# Patient Record
Sex: Female | Born: 1985 | Race: Black or African American | Hispanic: No | Marital: Married | Smoking: Current every day smoker
Health system: Southern US, Community
[De-identification: ages and names within clinical notes are randomized; demographics above are authoritative.]

---

## 2015-04-08 ENCOUNTER — Other Ambulatory Visit (HOSPITAL_COMMUNITY): Payer: Self-pay | Admitting: Specialist

## 2015-04-08 DIAGNOSIS — IMO0002 Reserved for concepts with insufficient information to code with codable children: Secondary | ICD-10-CM

## 2015-04-08 DIAGNOSIS — Z0489 Encounter for examination and observation for other specified reasons: Secondary | ICD-10-CM

## 2015-04-16 ENCOUNTER — Ambulatory Visit (HOSPITAL_COMMUNITY)
Admission: RE | Admit: 2015-04-16 | Discharge: 2015-04-16 | Disposition: A | Discharge status: Home / Self Care | Payer: Medicaid Other | Source: Ambulatory Visit | Attending: Specialist | Admitting: Specialist

## 2015-04-16 ENCOUNTER — Encounter (HOSPITAL_COMMUNITY): Payer: Self-pay

## 2015-04-16 DIAGNOSIS — Z3A25 25 weeks gestation of pregnancy: Secondary | ICD-10-CM | POA: Diagnosis not present

## 2015-04-16 DIAGNOSIS — O99212 Obesity complicating pregnancy, second trimester: Secondary | ICD-10-CM | POA: Insufficient documentation

## 2015-04-16 DIAGNOSIS — Z36 Encounter for antenatal screening of mother: Secondary | ICD-10-CM | POA: Insufficient documentation

## 2015-04-16 DIAGNOSIS — IMO0002 Reserved for concepts with insufficient information to code with codable children: Secondary | ICD-10-CM

## 2015-04-16 DIAGNOSIS — Z0489 Encounter for examination and observation for other specified reasons: Secondary | ICD-10-CM

## 2015-04-16 DIAGNOSIS — O3421 Maternal care for scar from previous cesarean delivery: Secondary | ICD-10-CM | POA: Insufficient documentation

## 2015-06-05 ENCOUNTER — Other Ambulatory Visit (HOSPITAL_COMMUNITY): Payer: Self-pay | Admitting: Specialist

## 2015-06-05 DIAGNOSIS — IMO0002 Reserved for concepts with insufficient information to code with codable children: Secondary | ICD-10-CM

## 2015-06-05 DIAGNOSIS — Z0489 Encounter for examination and observation for other specified reasons: Secondary | ICD-10-CM

## 2015-06-09 ENCOUNTER — Ambulatory Visit (HOSPITAL_COMMUNITY)
Admission: RE | Admit: 2015-06-09 | Discharge: 2015-06-09 | Disposition: A | Discharge status: Home / Self Care | Payer: Medicaid Other | Source: Ambulatory Visit | Attending: Specialist | Admitting: Specialist

## 2015-06-09 DIAGNOSIS — Z36 Encounter for antenatal screening of mother: Secondary | ICD-10-CM | POA: Insufficient documentation

## 2015-06-09 DIAGNOSIS — IMO0002 Reserved for concepts with insufficient information to code with codable children: Secondary | ICD-10-CM

## 2015-06-09 DIAGNOSIS — Z0489 Encounter for examination and observation for other specified reasons: Secondary | ICD-10-CM

## 2016-02-19 ENCOUNTER — Encounter (HOSPITAL_COMMUNITY): Payer: Self-pay | Admitting: *Deleted

## 2016-07-19 ENCOUNTER — Other Ambulatory Visit (HOSPITAL_COMMUNITY): Payer: Self-pay | Admitting: Obstetrics and Gynecology

## 2016-07-20 ENCOUNTER — Other Ambulatory Visit (HOSPITAL_COMMUNITY): Payer: Self-pay | Admitting: Obstetrics and Gynecology

## 2016-07-20 DIAGNOSIS — O99212 Obesity complicating pregnancy, second trimester: Secondary | ICD-10-CM

## 2016-07-20 DIAGNOSIS — Z3A25 25 weeks gestation of pregnancy: Secondary | ICD-10-CM

## 2016-07-21 ENCOUNTER — Encounter (HOSPITAL_COMMUNITY): Payer: Self-pay | Admitting: Obstetrics and Gynecology

## 2016-08-02 ENCOUNTER — Other Ambulatory Visit (HOSPITAL_COMMUNITY): Payer: Self-pay | Admitting: Obstetrics and Gynecology

## 2016-08-02 ENCOUNTER — Ambulatory Visit (HOSPITAL_COMMUNITY)
Admission: RE | Admit: 2016-08-02 | Discharge: 2016-08-02 | Disposition: A | Discharge status: Home / Self Care | Payer: Medicaid Other | Source: Ambulatory Visit | Attending: Obstetrics and Gynecology | Admitting: Obstetrics and Gynecology

## 2016-08-02 DIAGNOSIS — Z1389 Encounter for screening for other disorder: Secondary | ICD-10-CM

## 2016-08-02 DIAGNOSIS — Z3A25 25 weeks gestation of pregnancy: Secondary | ICD-10-CM | POA: Diagnosis not present

## 2016-08-02 DIAGNOSIS — O99212 Obesity complicating pregnancy, second trimester: Secondary | ICD-10-CM | POA: Diagnosis present

## 2016-08-02 IMAGING — US US MFM OB DETAIL+14 WK
1 series · 13 of 28 positions shown · non-contrast
Comparison: none

[Series 1: us mfm ob detail+14 wk · 13 of 42 slices shown]
[im 2/42]
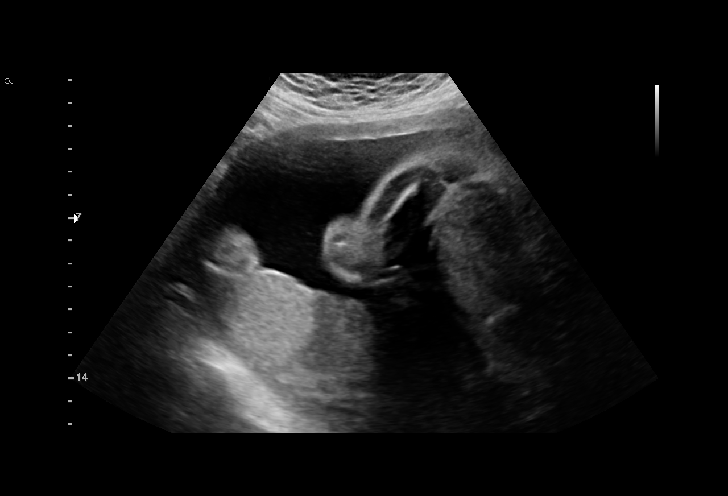
[im 5/42]
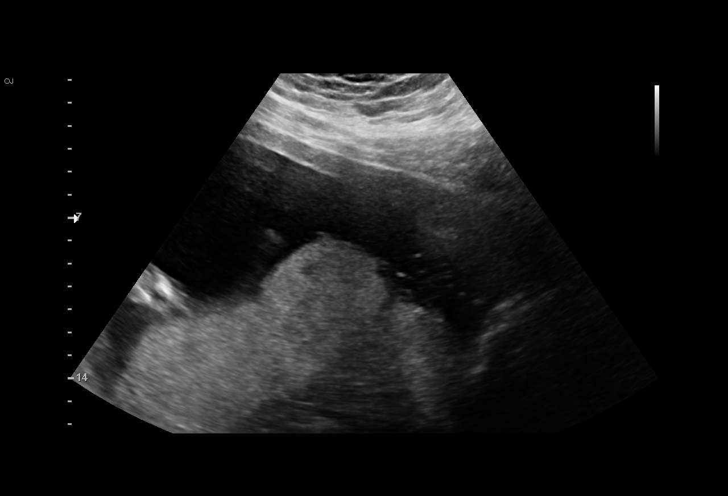
[im 8/42]
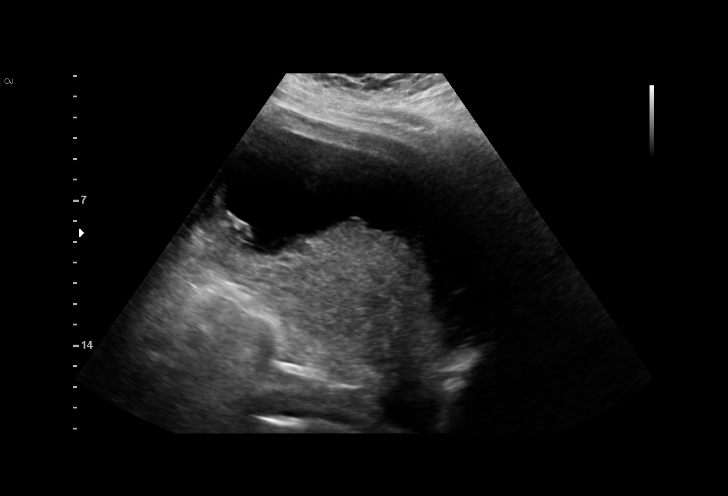
[im 11/42]
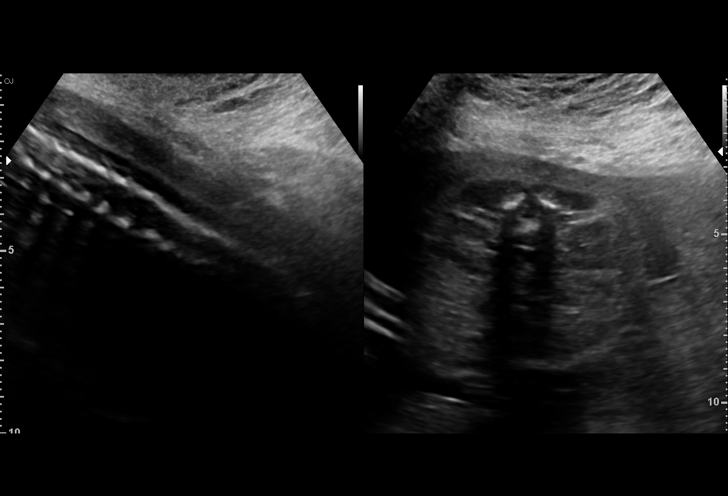
[im 14/42]
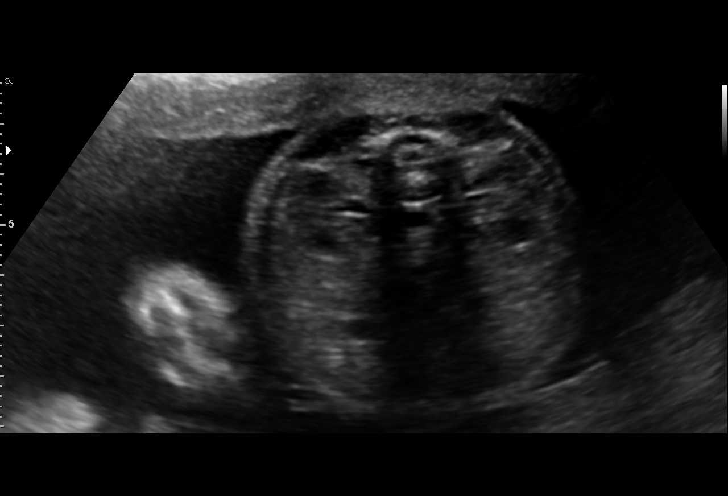
[im 17/42]
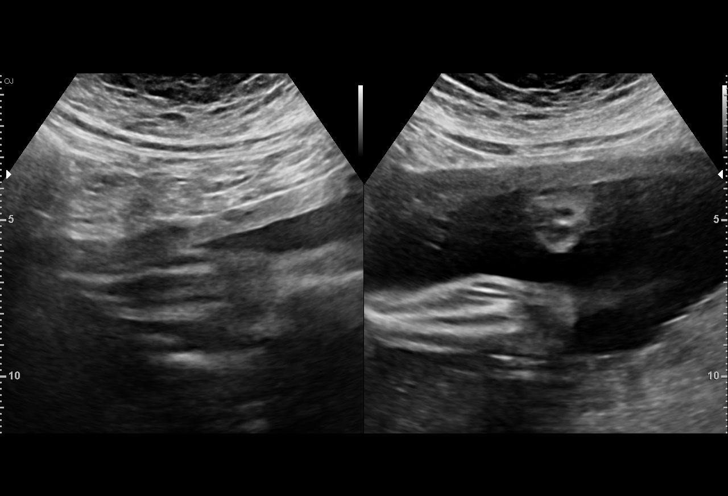
[im 22/42]
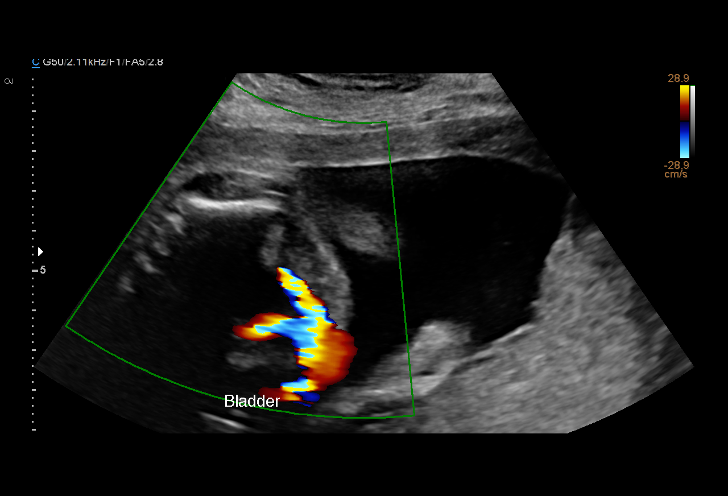
[im 25/42]
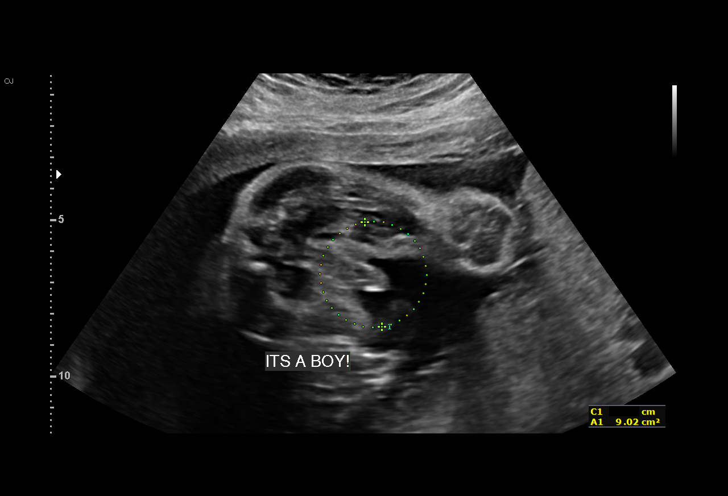
[im 28/42]
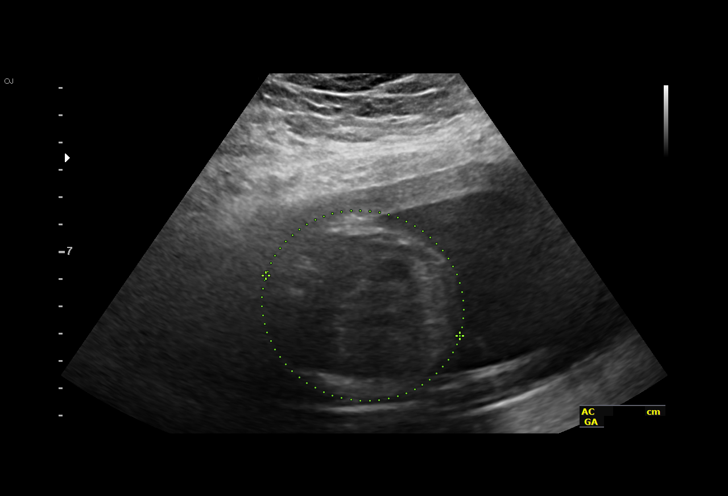
[im 31/42]
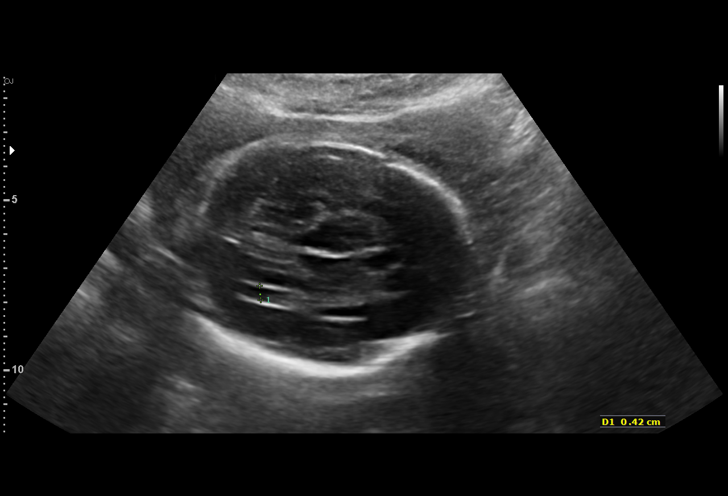
[im 34/42]
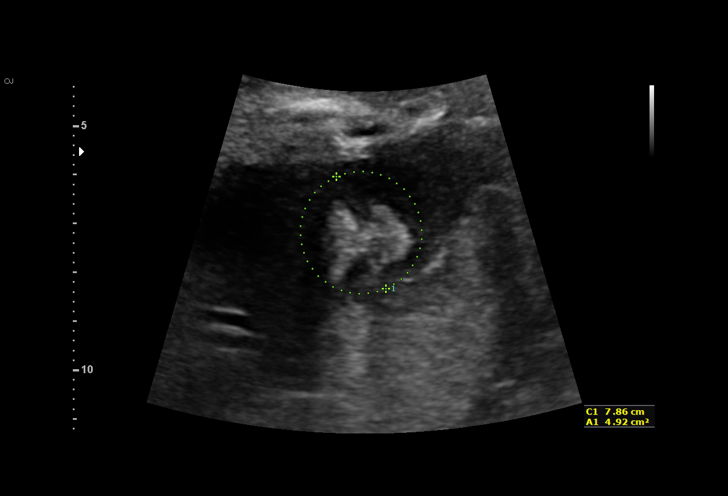
[im 37/42]
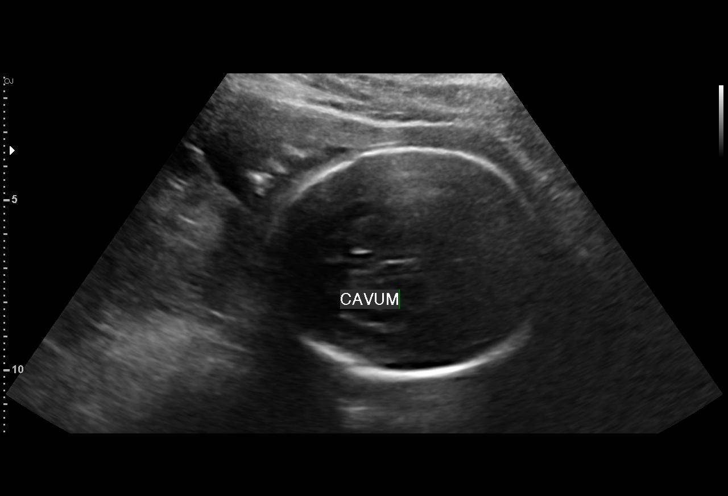
[im 40/42]
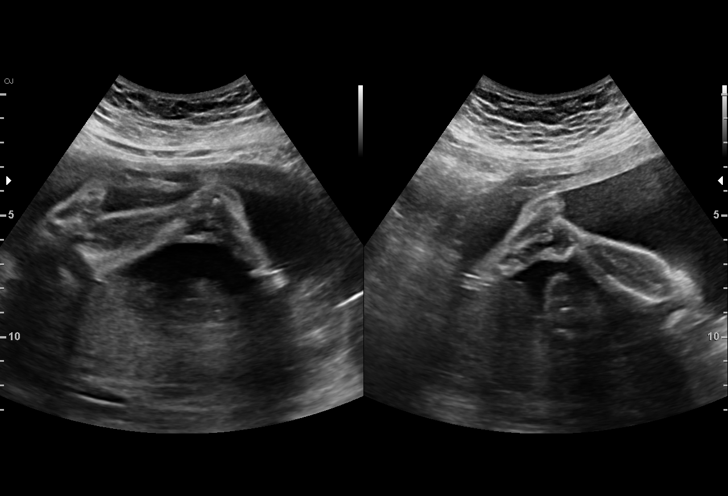

[13 of 28 positions shown; findings below may reference images not displayed]

[HOSPITAL][HOSPITAL]

1  [facility]             [PHONE_NUMBER]      [PHONE_NUMBER]     [PHONE_NUMBER]
Indications

25 weeks gestation of pregnancy
Maternal morbid obesity                        [7L] [7L]
Encounter for antenatal screening,             [7L]
unspecified
OB History

Blood Type:            Height:         Weight (lb):  341      BMI:
Gravidity:    2         Term:   1        Prem:   0        SAB:   0
TOP:          0       Ectopic:  0        Living: 2
Fetal Evaluation

Num Of Fetuses:     1
Fetal Heart         143
Rate(bpm):
Cardiac Activity:   Observed
Presentation:       Cephalic
Placenta:           Posterior, above cervical os
P. Cord Insertion:  Not well visualized

Amniotic Fluid
AFI FV:      Subjectively within normal limits

Largest Pocket(cm)
6.04
Biometry

BPD:      65.5  mm     G. Age:  26w 3d         61  %    CI:        71.23   %   70 - 86
FL/HC:      20.9   %   18.6 -
HC:      247.2  mm     G. Age:  26w 6d         60  %    HC/AC:      1.11       1.04 -
AC:      222.8  mm     G. Age:  26w 5d         67  %    FL/BPD:     78.8   %   71 - 87
FL:       51.6  mm     G. Age:  27w 4d         83  %    FL/AC:      23.2   %   20 - 24
HUM:      52.8  mm     G. Age:  30w 5d       > 95  %

Est. FW:    [7L]  gm      2 lb 4 oz     76  %
Gestational Age

U/S Today:     26w 6d                                        EDD:   [DATE]
Best:          25w 6d    Det. By:   Early Ultrasound         EDD:   [DATE]
([DATE])
Anatomy

Cranium:               Appears normal         Aortic Arch:            Not well visualized
Cavum:                 Appears normal         Ductal Arch:            Not well visualized
Ventricles:            Appears normal         Diaphragm:              Not well visualized
Choroid Plexus:        Not well visualized    Stomach:                Appears normal, left
sided
Cerebellum:            Not well visualized    Abdomen:                Appears normal
Posterior Fossa:       Not well visualized    Abdominal Wall:         Not well visualized
Nuchal Fold:           Not well visualized    Cord Vessels:           Appears normal (3
vessel cord)
Face:                  Not well visualized    Kidneys:                Appear normal
Lips:                  Appears normal         Bladder:                Appears normal
Thoracic:              Appears normal         Spine:                  Appears normal
Heart:                 Not well visualized    Upper Extremities:      Appears normal
RVOT:                  Not well visualized    Lower Extremities:      Appears normal
LVOT:                  Not well visualized

Other:  Male gender. Heels visualized. Technically difficult due to maternal
habitus and fetal position.
Cervix Uterus Adnexa

Cervix
Length:            3.8  cm.
Normal appearance by transabdominal scan.

Uterus
No abnormality visualized.

Left Ovary
Not visualized. No adnexal mass visualized.

Right Ovary
Not visualized. No adnexal mass visualized.
Impression

Singleton intrauterine pregnancy at 25+6 weeks
Review of the anatomy shows no sonographic markers for
aneuploidy or structural anomalies
However,evaluations of both intracranial and cardiac
anatomy should be considered suboptimal secondary to
maternal body habitus
Amniotic fluid volume is normal with an MVP of 6cm
Estimated fetal weight is 1013g which is growth in the 76th
percentile
Recommendations

Given the gestational age and the maternal body habitus, it is
unlikely that repeat US evaluations will completely or clearly
complete the anatomic survey. However, since the
nonvisualized systems are the neuroaxis and the heart, I
would recommend at least one additional attempt in 2 to 4
weeks, at your discretion

## 2021-01-19 ENCOUNTER — Other Ambulatory Visit: Payer: Self-pay

## 2021-01-19 ENCOUNTER — Emergency Department (HOSPITAL_COMMUNITY)
Admission: EM | Admit: 2021-01-19 | Discharge: 2021-01-20 | Disposition: A | Discharge status: Home / Self Care | Payer: Self-pay | Attending: Emergency Medicine | Admitting: Emergency Medicine

## 2021-01-19 ENCOUNTER — Encounter (HOSPITAL_COMMUNITY): Payer: Self-pay | Admitting: Emergency Medicine

## 2021-01-19 DIAGNOSIS — Z5321 Procedure and treatment not carried out due to patient leaving prior to being seen by health care provider: Secondary | ICD-10-CM | POA: Insufficient documentation

## 2021-01-19 DIAGNOSIS — M546 Pain in thoracic spine: Secondary | ICD-10-CM | POA: Insufficient documentation

## 2021-01-19 DIAGNOSIS — Y92410 Unspecified street and highway as the place of occurrence of the external cause: Secondary | ICD-10-CM | POA: Insufficient documentation

## 2021-01-19 DIAGNOSIS — R519 Headache, unspecified: Secondary | ICD-10-CM | POA: Insufficient documentation
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
## 2021-01-20 NOTE — ED Notes (Signed)
Patient called x1 for vitals recheck with no response. Patient seen leaving out 30 minutes prior but hasnt seen entering.
